# Patient Record
Sex: Male | Born: 1950 | Race: White | Hispanic: No | Marital: Married | Smoking: Never smoker
Health system: Southern US, Community
[De-identification: ages and names within clinical notes are randomized; demographics above are authoritative.]

## PROBLEM LIST (undated history)

## (undated) DIAGNOSIS — I4891 Unspecified atrial fibrillation: Secondary | ICD-10-CM

## (undated) HISTORY — PX: THORACOTOMY: SUR1349

---

## 2019-09-24 ENCOUNTER — Encounter (HOSPITAL_BASED_OUTPATIENT_CLINIC_OR_DEPARTMENT_OTHER): Payer: Self-pay | Admitting: Emergency Medicine

## 2019-09-24 ENCOUNTER — Emergency Department (HOSPITAL_BASED_OUTPATIENT_CLINIC_OR_DEPARTMENT_OTHER): Payer: Medicare Other

## 2019-09-24 ENCOUNTER — Other Ambulatory Visit: Payer: Self-pay

## 2019-09-24 ENCOUNTER — Emergency Department (HOSPITAL_BASED_OUTPATIENT_CLINIC_OR_DEPARTMENT_OTHER)
Admission: EM | Admit: 2019-09-24 | Discharge: 2019-09-24 | Disposition: A | Discharge status: Other Acute Inpatient Hospital | Payer: Medicare Other | Attending: Internal Medicine | Admitting: Internal Medicine

## 2019-09-24 DIAGNOSIS — R1011 Right upper quadrant pain: Secondary | ICD-10-CM

## 2019-09-24 DIAGNOSIS — K56609 Unspecified intestinal obstruction, unspecified as to partial versus complete obstruction: Secondary | ICD-10-CM

## 2019-09-24 DIAGNOSIS — I4891 Unspecified atrial fibrillation: Secondary | ICD-10-CM | POA: Diagnosis present

## 2019-09-24 DIAGNOSIS — Z20822 Contact with and (suspected) exposure to covid-19: Secondary | ICD-10-CM | POA: Insufficient documentation

## 2019-09-24 DIAGNOSIS — R112 Nausea with vomiting, unspecified: Secondary | ICD-10-CM

## 2019-09-24 HISTORY — DX: Unspecified atrial fibrillation: I48.91

## 2019-09-24 LAB — CBC
HCT: 47.6 % (ref 39.0–52.0)
Hemoglobin: 16.4 g/dL (ref 13.0–17.0)
MCH: 32.4 pg (ref 26.0–34.0)
MCHC: 34.5 g/dL (ref 30.0–36.0)
MCV: 94.1 fL (ref 80.0–100.0)
Platelets: 211 10*3/uL (ref 150–400)
RBC: 5.06 MIL/uL (ref 4.22–5.81)
RDW: 13.2 % (ref 11.5–15.5)
WBC: 9.1 10*3/uL (ref 4.0–10.5)
nRBC: 0 % (ref 0.0–0.2)

## 2019-09-24 LAB — URINALYSIS, ROUTINE W REFLEX MICROSCOPIC
Glucose, UA: NEGATIVE mg/dL
Ketones, ur: 15 mg/dL — AB
Leukocytes,Ua: NEGATIVE
Nitrite: NEGATIVE
Protein, ur: NEGATIVE mg/dL
Specific Gravity, Urine: 1.025 (ref 1.005–1.030)
pH: 6 (ref 5.0–8.0)

## 2019-09-24 LAB — COMPREHENSIVE METABOLIC PANEL
ALT: 15 U/L (ref 0–44)
AST: 29 U/L (ref 15–41)
Albumin: 4.2 g/dL (ref 3.5–5.0)
Alkaline Phosphatase: 47 U/L (ref 38–126)
Anion gap: 14 (ref 5–15)
BUN: 27 mg/dL — ABNORMAL HIGH (ref 8–23)
CO2: 25 mmol/L (ref 22–32)
Calcium: 9.9 mg/dL (ref 8.9–10.3)
Chloride: 98 mmol/L (ref 98–111)
Creatinine, Ser: 1.23 mg/dL (ref 0.61–1.24)
GFR calc Af Amer: >60 mL/min (ref >60)
GFR calc non Af Amer: 59 mL/min — ABNORMAL LOW (ref >60)
Glucose, Bld: 133 mg/dL — ABNORMAL HIGH (ref 70–99)
Potassium: 4.2 mmol/L (ref 3.5–5.1)
Sodium: 137 mmol/L (ref 135–145)
Total Bilirubin: 2.2 mg/dL — ABNORMAL HIGH (ref 0.3–1.2)
Total Protein: 7 g/dL (ref 6.5–8.1)

## 2019-09-24 LAB — URINALYSIS, MICROSCOPIC (REFLEX)

## 2019-09-24 LAB — TROPONIN I (HIGH SENSITIVITY)
Troponin I (High Sensitivity): 8 ng/L (ref <18)
Troponin I (High Sensitivity): 9 ng/L (ref <18)

## 2019-09-24 LAB — OCCULT BLOOD X 1 CARD TO LAB, STOOL: Fecal Occult Bld: NEGATIVE

## 2019-09-24 LAB — SARS CORONAVIRUS 2 BY RT PCR (HOSPITAL ORDER, PERFORMED IN ~~LOC~~ HOSPITAL LAB): SARS Coronavirus 2: NEGATIVE

## 2019-09-24 LAB — LIPASE, BLOOD: Lipase: 25 U/L (ref 11–51)

## 2019-09-24 LAB — PROTIME-INR
INR: 1.1 (ref 0.8–1.2)
Prothrombin Time: 13.9 seconds (ref 11.4–15.2)

## 2019-09-24 MED ORDER — METOPROLOL TARTRATE 5 MG/5ML IV SOLN
5.0000 mg | Freq: Once | INTRAVENOUS | Status: AC
  Administered 2019-09-24: 5 mg via INTRAVENOUS
  Filled 2019-09-24: qty 5

## 2019-09-24 MED ORDER — SODIUM CHLORIDE 0.9 % IV BOLUS
500.0000 mL | Freq: Once | INTRAVENOUS | Status: AC
  Administered 2019-09-24: 500 mL via INTRAVENOUS

## 2019-09-24 MED ORDER — IOHEXOL 300 MG/ML  SOLN
100.0000 mL | Freq: Once | INTRAMUSCULAR | Status: AC | PRN
  Administered 2019-09-24: 100 mL via INTRAVENOUS

## 2019-09-24 MED ORDER — APIXABAN 2.5 MG PO TABS: 5.0000 mg | ORAL_TABLET | Freq: Two times a day (BID) | ORAL | Status: DC

## 2019-09-24 MED ORDER — SODIUM CHLORIDE 0.9 % IV BOLUS (SEPSIS)
500.0000 mL | Freq: Once | INTRAVENOUS | Status: AC
  Administered 2019-09-24: 500 mL via INTRAVENOUS

## 2019-09-24 MED ORDER — SODIUM CHLORIDE 0.9 % IV SOLN
1000.0000 mL | INTRAVENOUS | Status: DC
  Administered 2019-09-24: 1000 mL via INTRAVENOUS

## 2019-09-24 MED ORDER — FLECAINIDE ACETATE 50 MG PO TABS
50.0000 mg | ORAL_TABLET | Freq: Once | ORAL | Status: AC
  Administered 2019-09-24: 50 mg via ORAL
  Filled 2019-09-24: qty 1

## 2019-09-24 MED ORDER — HEPARIN (PORCINE) 25000 UT/250ML-% IV SOLN
1250.0000 [IU]/h | INTRAVENOUS | Status: DC
  Administered 2019-09-24: 1250 [IU]/h via INTRAVENOUS
  Filled 2019-09-24: qty 250

## 2019-09-24 NOTE — ED Provider Notes (Signed)
Blood pressure (!) 124/91, pulse (!) 128, temperature 99.4 F (37.4 C), temperature source Oral, resp. rate (!) 24, height 5\' 9"  (1.753 m), weight 83.9 kg, SpO2 96 %.  Assuming care from Dr. .  In short, Alvin Rodriguez is a 69 y.o. male with a chief complaint of Abdominal Pain .  Refer to the original H&P for additional details.  04:45 PM  Patient's A. fib with RVR better rate controlled with as needed Lopressor and flecainide.  At the time of shift change the patient expressed her preference to actually be admitted to [facility].  They contacted their cardiology team and a bed will be made for the patient.  I discussed the case with Dr. [facility] including the relatively late finding of small bowel obstruction on CT scan.  Plan to place NG tube here.  Dr. Lucianne Muss will accept the patient to their medical service and they will consult general surgery once the patient arrives.   Discussed patient's case with Hospitalist Dr. Lucianne Muss to request admission. Patient and family (if present) updated with plan. Care transferred to Hospitalist service.  I reviewed all nursing notes, vitals, pertinent old records, EKGs, labs, imaging (as available).     Lucianne Muss, MD 09/24/19 [phone]

## 2019-09-24 NOTE — ED Notes (Signed)
Shirt, shoes and pants in pt belonging bag and given to transport team members

## 2019-09-24 NOTE — ED Notes (Signed)
[facility] - Dr Sampson Goon, MD

## 2019-09-24 NOTE — ED Notes (Signed)
[facility] Transport Team at bedside 

## 2019-09-24 NOTE — ED Triage Notes (Signed)
Mid to upper abd pain with vomiting since yesterday.

## 2019-09-24 NOTE — ED Notes (Signed)
ED Provider at bedside. To discuss findings of CT scan, plan of care and also on status of tx to [facility]

## 2019-09-24 NOTE — ED Notes (Signed)
Moved to exam room ED4, cont on cardiac monitoring, Afib with RVR at times. Pt remains NPO until further notice

## 2019-09-24 NOTE — ED Notes (Signed)
PCXR done

## 2019-09-24 NOTE — ED Notes (Signed)
No further vomiting noted since arrival to ED

## 2019-09-24 NOTE — ED Notes (Signed)
VS assessed prior to IV meds

## 2019-09-24 NOTE — ED Notes (Signed)
ED Provider at bedside. Informed that Metoprolol 25mg  PO, Flecanide 50mg  PO and Eliquis 5mg  PO was last take yesterday MA

## 2019-09-24 NOTE — ED Notes (Signed)
 NS bolus initiated per verbal orders of EDP, to follow with 142ml/hr

## 2019-09-24 NOTE — ED Provider Notes (Signed)
[facility] EMERGENCY DEPARTMENT Provider Note   CSN: [id number] Arrival date & time: 09/24/19  0957     History Chief Complaint  Patient presents with   Abdominal Pain    Alvin Rodriguez is a 69 y.o. male.  HPI Patient reports he awakened yesterday morning and by midmorning felt some upper abdominal discomfort.  He did not think it was too serious.  Several hours later however pain got much worse and he vomited a large amount.  He reports is a vomitus was dark in appearance.  He continued to have persistent pain which was fairly aching in pressure-like in the mid and upper abdomen.  He went on to have dry heaves through the afternoon.  He was unable to tolerate his medications yesterday.  Later in the night he had another episode of large amount of emesis with dark appearance.  This morning, severity and quality of abdominal pain have diminished and he now rates his abdominal pain is just a "discomfort".  Patient reports he cannot typically tell if he is in atrial fibrillation.  He does not perceive his heart to be racing or missing beats.  Due to vomiting episodes and abdominal pain, he has missed his doses of metoprolol and flecainide.  Also he has missed Eliquis.  He denies he feels significantly short of breath.  He has never experienced similar abdominal pain problems.  No history of abdominal surgeries.    Past Medical History:  Diagnosis Date   Atrial fibrillation [facility])     Patient Active Problem List   Diagnosis Date Noted   Atrial fibrillation with RVR (HCC) 09/24/2019    Past Surgical History:  Procedure Laterality Date   THORACOTOMY         No family history on file.  Social History   Tobacco Use   Smoking status: Never Smoker   Smokeless tobacco: Never Used  Substance Use Topics   Alcohol use: Yes    Comment: daily   Drug use: Never    Home Medications Prior to Admission medications   Medication Sig Start Date End Date Taking? Authorizing  Provider  apixaban (ELIQUIS) 5 MG TABS tablet Take 5 mg by mouth 2 (two) times daily.   Yes [provider]  flecainide (TAMBOCOR) 50 MG tablet Take 50 mg by mouth 2 (two) times daily.   Yes [provider]  metoprolol tartrate (LOPRESSOR) 25 MG tablet Take 25 mg by mouth 2 (two) times daily.   Yes [provider]  promethazine (PHENERGAN) 25 MG tablet Take 25 mg by mouth every 6 (six) hours as needed for nausea or vomiting.   Yes [provider]  sucralfate (CARAFATE) 1 g tablet Take 1 g by mouth 4 (four) times daily -  with meals and at bedtime.   Yes [provider]    Allergies    Patient has no known allergies.  Review of Systems   Review of Systems 10 systems reviewed and negative except as per HPI Physical Exam Updated Vital Signs BP (!) 124/91 (BP Location: Right Arm)    Pulse (!) 128 Comment: Afib   Temp 99.4 F (37.4 C) (Oral)    Resp (!) 24    Ht 5\' 9"  (1.753 m)    Wt 83.9 kg    SpO2 96%    BMI 27.32 kg/m   Physical Exam Constitutional:      Comments: Alert and nontoxic.  Mental status clear.  Well-nourished well-developed.  No respiratory distress at rest.  HENT:     Mouth/Throat:     Mouth: Mucous membranes are moist.     Pharynx: Oropharynx is clear.  Eyes:     Extraocular Movements: Extraocular movements intact.     Conjunctiva/sclera: Conjunctivae normal.  Cardiovascular:     Comments: Tachycardia, irregularly irregular. Pulmonary:     Effort: Pulmonary effort is normal.     Breath sounds: Normal breath sounds.  Abdominal:     Comments: Abdomen is soft without guarding.  Very mild epigastric discomfort to palpation.  No guarding in right upper quadrant or right lower quadrant.  Genitourinary:    Comments: Prostate mildly enlarged.  Brown stool in the vault.  No melena no visible blood. Musculoskeletal:        General: No swelling or tenderness. Normal range of motion.     Right lower leg: No edema.     Left lower  leg: No edema.  Skin:    General: Skin is warm and dry.  Neurological:     General: No focal deficit present.     Mental Status: He is oriented to person, place, and time.     Coordination: Coordination normal.  Psychiatric:        Mood and Affect: Mood normal.     ED Results / Procedures / Treatments   Labs (all labs ordered are listed, but only abnormal results are displayed) Labs Reviewed  COMPREHENSIVE METABOLIC PANEL - Abnormal; Notable for the following components:      Result Value   Glucose, Bld 133 (*)    BUN 27 (*)    Total Bilirubin 2.2 (*)    GFR calc non Af Amer 59 (*)    All other components within normal limits  URINALYSIS, ROUTINE W REFLEX MICROSCOPIC - Abnormal; Notable for the following components:   Hgb urine dipstick SMALL (*)    Bilirubin Urine SMALL (*)    Ketones, ur 15 (*)    All other components within normal limits  URINALYSIS, MICROSCOPIC (REFLEX) - Abnormal; Notable for the following components:   Bacteria, UA MANY (*)    All other components within normal limits  SARS CORONAVIRUS 2 BY RT PCR (HOSPITAL ORDER, PERFORMED IN [facility])  LIPASE, BLOOD  CBC  PROTIME-INR  OCCULT BLOOD X 1 CARD TO LAB, STOOL  TROPONIN I (HIGH SENSITIVITY)  TROPONIN I (HIGH SENSITIVITY)    EKG EKG Interpretation  Date/Time:  Saturday September 24 2019 11:17:57 EDT Ventricular Rate:  162 PR Interval:    QRS Duration: 84 QT Interval:  298 QTC Calculation: 489 R Axis:   16 Text Interpretation: ST & T wave abnormality, consider lateral ischemia Abnormal ECG Confirmed by Arby Barrette [phone]) on 09/24/2019 2:14:04 PM   Radiology CT Abdomen Pelvis W Contrast  Result Date: 09/24/2019 CLINICAL DATA:  Nausea and vomiting with abdominal pain. EXAM: CT ABDOMEN AND PELVIS WITH CONTRAST TECHNIQUE: Multidetector CT imaging of the abdomen and pelvis was performed using the standard protocol following bolus administration of intravenous contrast. CONTRAST:   OMNIPAQUE IOHEXOL 300 MG/ML  SOLN COMPARISON:  None. FINDINGS: Lower chest: A chronic deformity of the posterior lateral left ribcage is seen. Mild atelectasis is noted within the right lung base. Hepatobiliary: No focal liver abnormality is seen. No gallstones, gallbladder wall thickening, or biliary dilatation. Pancreas: Unremarkable. No pancreatic ductal dilatation or surrounding inflammatory changes. Spleen: Normal in size without focal abnormality. Adrenals/Urinary Tract: Adrenal glands are unremarkable. Kidneys are normal, without renal calculi, focal lesion, or hydronephrosis. The  urinary bladder is limited in evaluation secondary to the presence of overlying streak artifact. Stomach/Bowel: There is a small hiatal hernia. Appendix appears normal. Several dilated small bowel loops are seen within the lower abdomen and pelvis a transition zone is seen within the anterior aspect of the mid to lower right abdomen (axial CT images 48 through 58, CT series number 2). Noninflamed diverticula are seen throughout the large bowel. Vascular/Lymphatic: There is mild calcification of the abdominal aorta and bilateral common iliac arteries, without evidence of aneurysmal dilatation. No enlarged abdominal or pelvic lymph nodes. Reproductive: The prostate gland appears to be normal but is limited in evaluation secondary to overlying streak artifact. Other: A 7.9 cm x 1.5 cm fat containing ventral hernia is seen along the midline of the mid to lower abdomen. No abdominopelvic ascites. Musculoskeletal: A total left hip replacement is seen with associated streak artifact and subsequently limited evaluation of the adjacent osseous and soft tissue structures. There is approximately 2 mm anterolisthesis of the L4 vertebral body on L5 with moderate to marked severity multilevel degenerative changes noted throughout the lumbar spine. IMPRESSION: 1. Small-bowel obstruction with a transition zone seen within the anterior aspect  of the mid to lower right abdomen. 2. Colonic diverticulosis. 3. Small hiatal hernia. 4. 7.9 cm x 1.5 cm fat containing ventral hernia. 5. Total left hip replacement. 6. 2 mm anterolisthesis of the L4 vertebral body on L5 with moderate to marked severity multilevel degenerative changes throughout the lumbar spine. 7. Aortic atherosclerosis. Aortic Atherosclerosis (ICD10-I70.0). Electronically Signed   By: Aram Candelahaddeus  Houston M.D.   On: 09/24/2019 15:12   DG Chest Port 1 View  Result Date: 09/24/2019 CLINICAL DATA:  69 year old male with a history of upper abdominal pain EXAM: PORTABLE CHEST 1 VIEW COMPARISON:  08/16/2019 FINDINGS: Cardiomediastinal silhouette unchanged in size and contour. Low lung volumes. No pneumothorax. No pleural effusion. No confluent airspace disease. Coarsened interstitial markings persist. IMPRESSION: Low lung volumes and chronic lung changes without evidence of acute cardiopulmonary disease. Electronically Signed   By: Gilmer MorJaime  Wagner D.O.   On: 09/24/2019 12:11   US Abdomen Limited RUQ  Result Date: 09/24/2019 CLINICAL DATA:  Epigastric pain and vomiting EXAM: ULTRASOUND ABDOMEN LIMITED RIGHT UPPER QUADRANT COMPARISON:  None. FINDINGS: Gallbladder: No gallstones or wall thickening visualized. No sonographic Murphy sign noted by sonographer. Common bile duct: Diameter: 2 mm, normal Liver: Evaluation is limited secondary to body habitus. No focal lesion identified. Within normal limits in parenchymal echogenicity. Portal vein is patent on color Doppler imaging with normal direction of blood flow towards the liver. Other: None. IMPRESSION: No sonographic etiology for acute abdominal pain identified. Electronically Signed   By: Meda KlinefelterStephanie  Peacock MD   On: 09/24/2019 12:22    Procedures Procedures (including critical care time) CRITICAL CARE Performed by: Arby BarretteMarcy Elston Aldape   Total critical care time: 45  minutes  Critical care time was exclusive of separately billable procedures  and treating other patients.  Critical care was necessary to treat or prevent imminent or life-threatening deterioration.  Critical care was time spent personally by me on the following activities: development of treatment plan with patient and/or surrogate as well as nursing, discussions with consultants, evaluation of patient's response to treatment, examination of patient, obtaining history from patient or surrogate, ordering and performing treatments and interventions, ordering and review of laboratory studies, ordering and review of radiographic studies, pulse oximetry and re-evaluation of patient's condition. Medications Ordered in ED Medications  sodium chloride 0.9 % bolus 500  mL (0 mLs Intravenous Stopped 09/24/19 1319)    Followed by  0.9 %  sodium chloride infusion (1,000 mLs Intravenous New Bag/Given 09/24/19 1534)  metoprolol tartrate (LOPRESSOR) injection 5 mg (5 mg Intravenous Given 09/24/19 1151)  sodium chloride 0.9 % bolus 500 mL (0 mLs Intravenous Stopped 09/24/19 1319)  metoprolol tartrate (LOPRESSOR) injection 5 mg (5 mg Intravenous Given 09/24/19 1249)  flecainide (TAMBOCOR) tablet 50 mg (50 mg Oral Given 09/24/19 1316)  metoprolol tartrate (LOPRESSOR) injection 5 mg (5 mg Intravenous Given 09/24/19 1503)  iohexol (OMNIPAQUE) 300 MG/ML solution 100 mL (100 mLs Intravenous Contrast Given 09/24/19 1439)    ED Course  I have reviewed the triage vital signs and the nursing notes.  Pertinent labs & imaging results that were available during my care of the patient were reviewed by me and considered in my medical decision making (see chart for details).  Clinical Course as of Sep 23 1613  Sat Sep 24, 2019  1423 Consult: Reviewed with Dr. Dairl Ponder for admission   [MP]    Clinical Course User Index [MP] Arby Barrette, MD   MDM Rules/Calculators/A&P                         Patient presents as outlined above.  He does have history of atrial fibrillation.  Patient was unable to take  his Eliquis and his metoprolol and flecainide due to symptoms.  He does present with atrial fibrillation rapid ventricular response.  Rehydration with normal saline started as well as rate control with metoprolol and patient's baseline flecainide dose.  CT scan does show small bowel obstruction.  Patient has not had any vomiting since being in the emergency department.  He also has not been having any ongoing abdominal pain.  At this time, due to bowel obstruction will hold Eliquis and need to review starting heparin with admitting team and surgery.  She remained stable with clear mental status and no respiratory distress.  We will continue hydration and n.p.o. status.  Dr. Jacqulyn Bath will discuss with admitting team. Final Clinical Impression(s) / ED Diagnoses Final diagnoses:  RUQ pain  Atrial fibrillation with rapid ventricular response (HCC)  Non-intractable vomiting with nausea, unspecified vomiting type  SBO (small bowel obstruction) (HCC)    Rx / DC Orders ED Discharge Orders    None       Arby Barrette, MD 09/24/19 1615

## 2019-09-24 NOTE — Progress Notes (Signed)
ANTICOAGULATION CONSULT NOTE - Initial Consult  Pharmacy Consult for heparin  Indication: atrial fibrillation  No Known Allergies  Patient Measurements: Height: 5\' 9"  (175.3 cm) Weight: 83.9 kg (185 lb) IBW/kg (Calculated) : 70.7 Heparin Dosing Weight: 83.9 kg   Vital Signs: Temp: 99.4 F (37.4 C) (09/18 1007) Temp Source: Oral (09/18 1007) BP: 124/91 (09/18 1529) Pulse Rate: 128 (09/18 1529)  Labs: Recent Labs    09/24/19 1009 09/24/19 1320  HGB 16.4  --   HCT 47.6  --   PLT 211  --   LABPROT 13.9  --   INR 1.1  --   CREATININE 1.23  --   TROPONINIHS 8 9    Estimated Creatinine Clearance: 55.9 mL/min (by C-G formula based on SCr of 1.23 mg/dL).   Medical History: Past Medical History:  Diagnosis Date  . Atrial fibrillation (HCC)     Medications:  (Not in a hospital admission)   Assessment: 69 YOM who presents with N/V. Of note, patient is on apixaban at home for h/o Afib but was unable to take his meds today in the setting of N/V. Pharmacy consulted to start IV heparin  H/H and Plt wnl. SCr 1.23.   Goal of Therapy:  Heparin level 0.3-0.7 units/ml Monitor platelets by anticoagulation protocol: Yes   Plan:  -IV heparin at 1250 units/hr. No since he is on apixaban at home.  -F/u 8 hr aPTT/HL -Monitor daily HL, CBC and s/s of bleeding   65, PharmD., BCPS, BCCCP Clinical Pharmacist Clinical phone for 09/24/19 until 10pm: [phone] If after 10pm, please refer to [facility] for unit-specific pharmacist

## 2021-03-27 IMAGING — CT CT ABD-PELV W/ CM
2 of 5 series · 15 of 46 positions shown, 17 images · IV contrast (Omnipaque)
Comparison: None.

CLINICAL DATA: Nausea and vomiting with abdominal pain.

EXAM:
CT ABDOMEN AND PELVIS WITH CONTRAST
TECHNIQUE: Multidetector CT imaging of the abdomen and pelvis was performed
using the standard protocol following bolus administration of
intravenous contrast.
CONTRAST:  100mL OMNIPAQUE IOHEXOL 300 MG/ML  SOLN

[Series 2: axial st · axial · 0.96mm/px · z∈[-578,-48]mm · 12 of 118 slices shown, 14 images]
[im 6/118  soft-tissue]
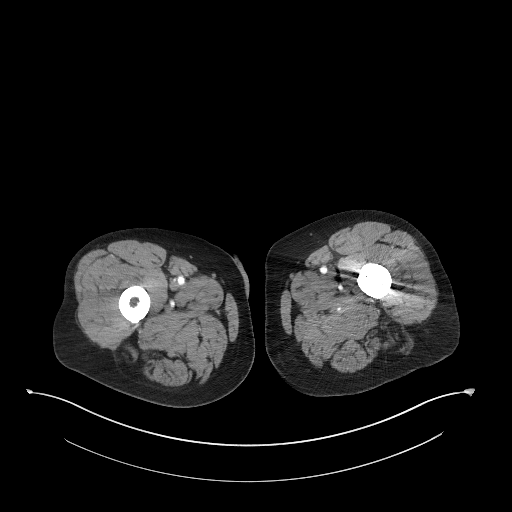
[im 6/118  bone]
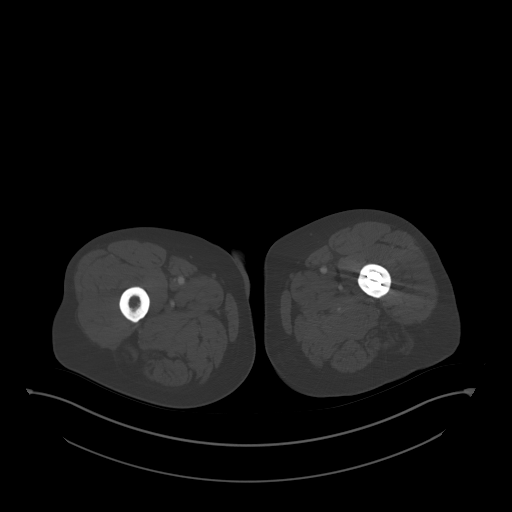
[im 17/118  soft-tissue]
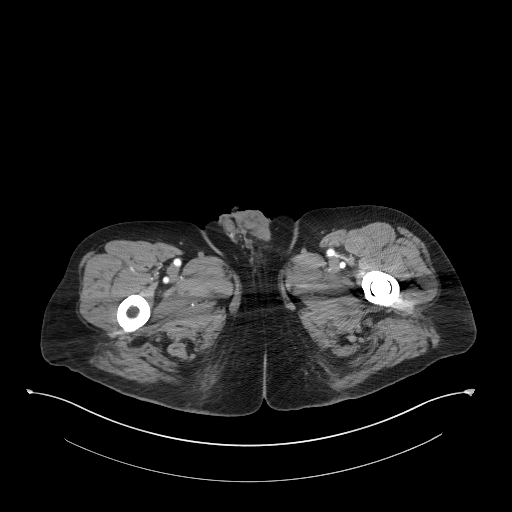
[im 28/118  soft-tissue]
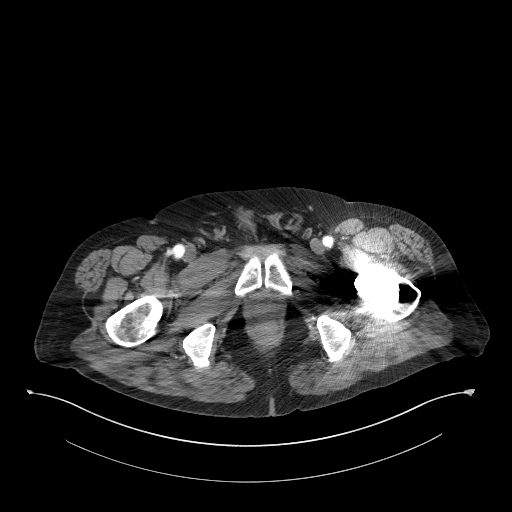
[im 34/118  soft-tissue]
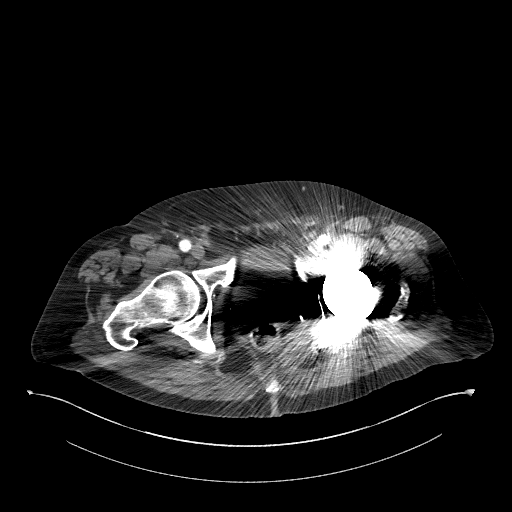
[im 45/118  soft-tissue]
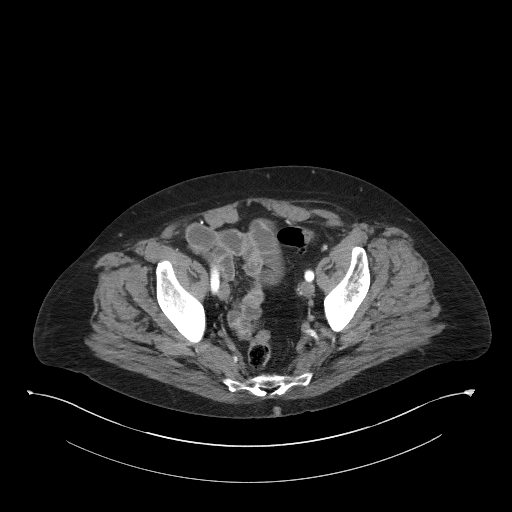
[im 56/118  soft-tissue]
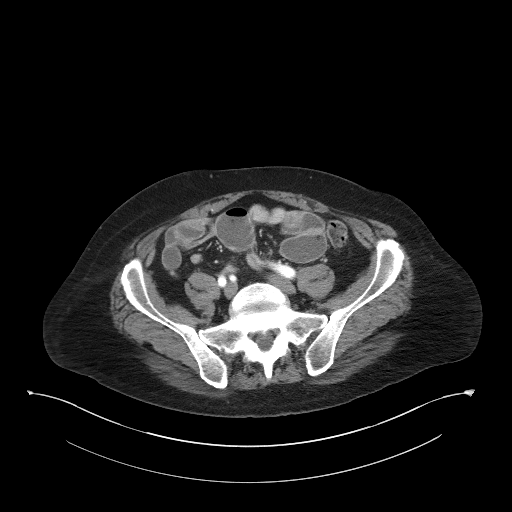
[im 62/118  soft-tissue]
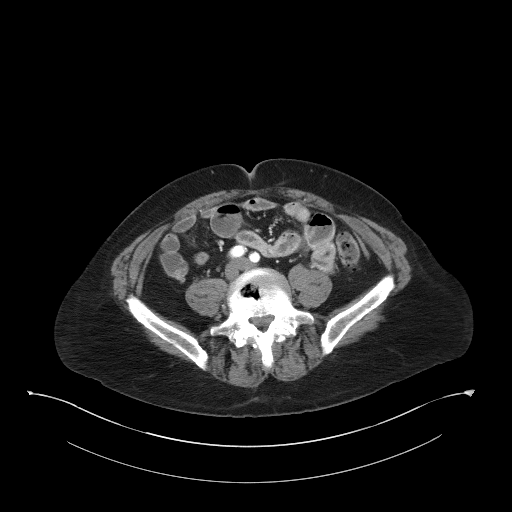
[im 73/118  soft-tissue]
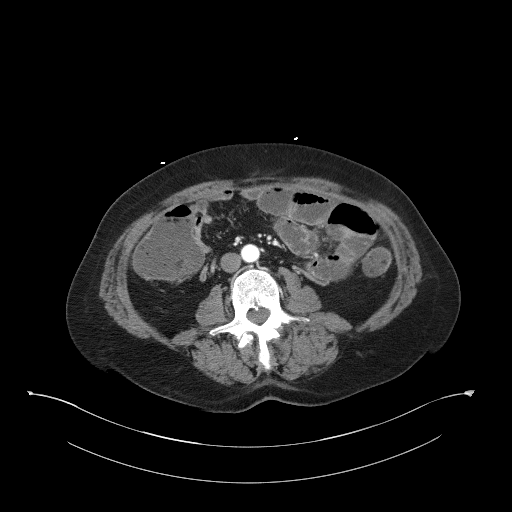
[im 84/118  soft-tissue]
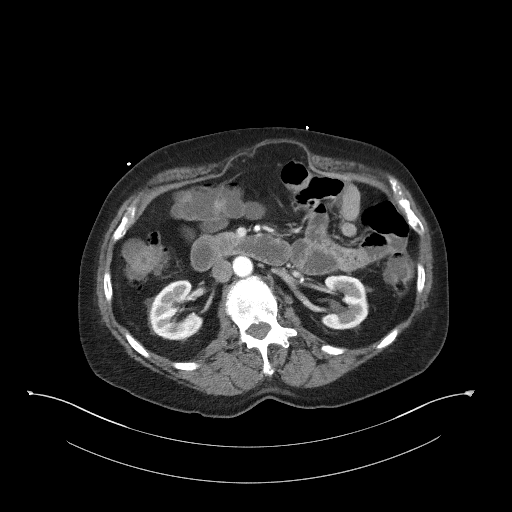
[im 84/118  bone]
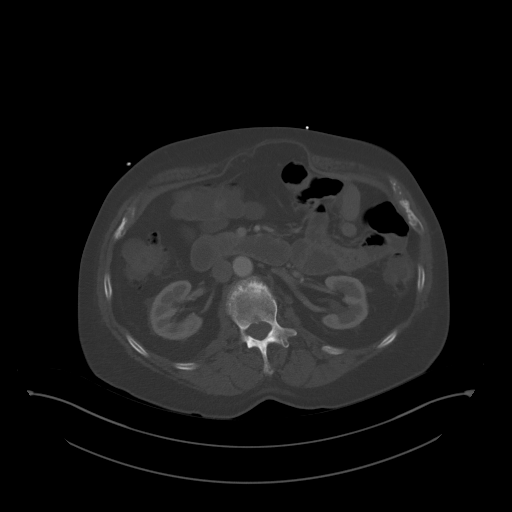
[im 90/118  soft-tissue]
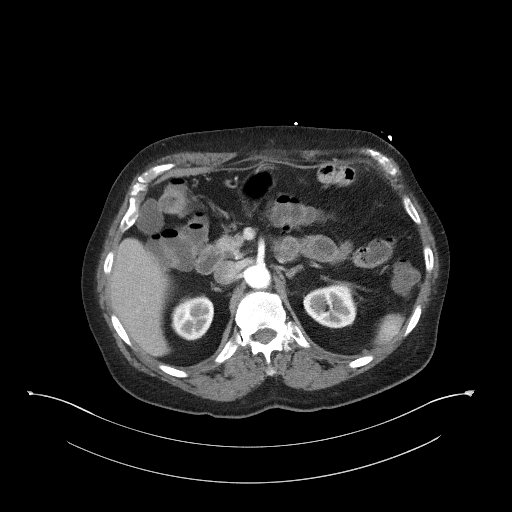
[im 101/118  soft-tissue]
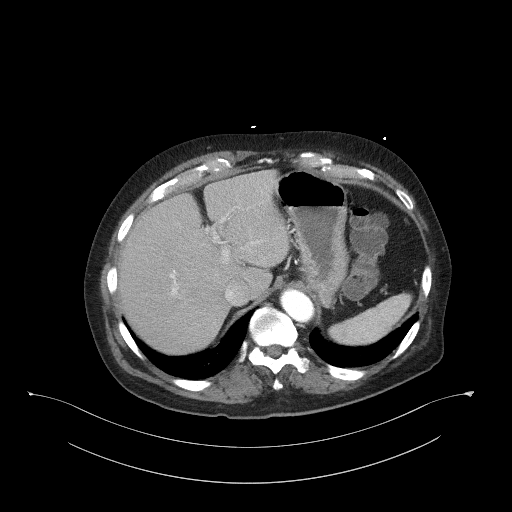
[im 112/118  soft-tissue]
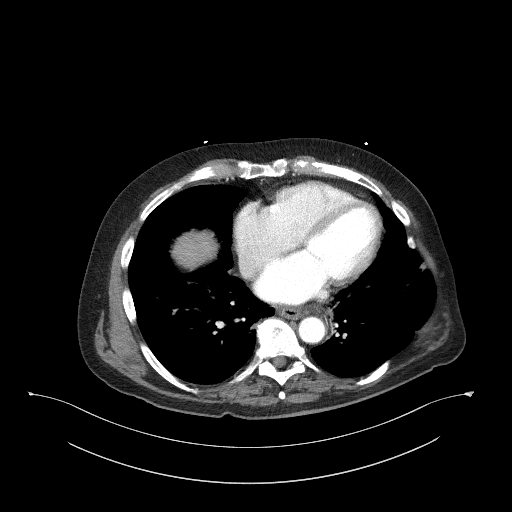

[Series 5: coronal st · coronal · 0.87mm/px · 3 of 95 slices shown]
[im 32/95  soft-tissue]
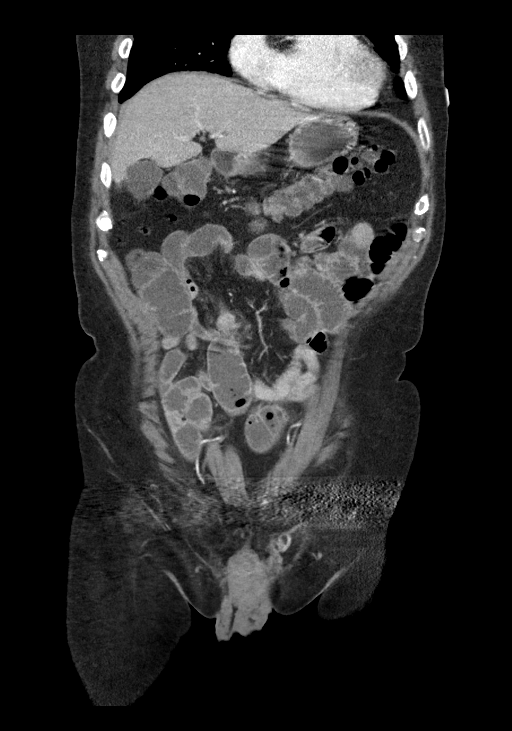
[im 42/95  soft-tissue]
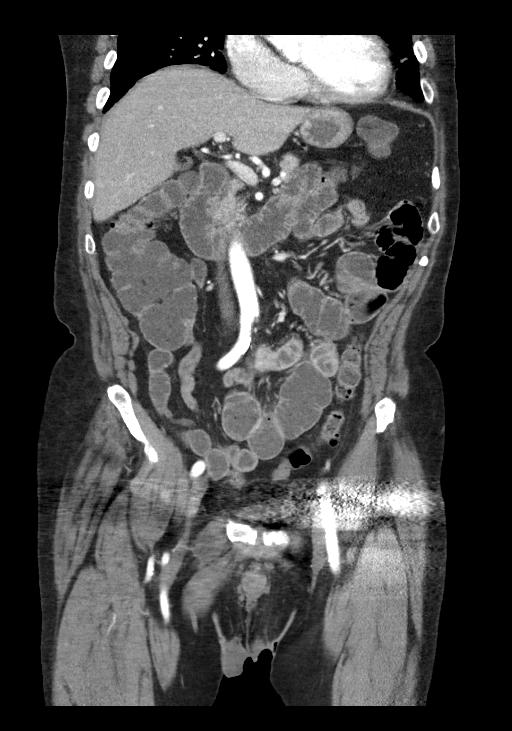
[im 53/95  soft-tissue]
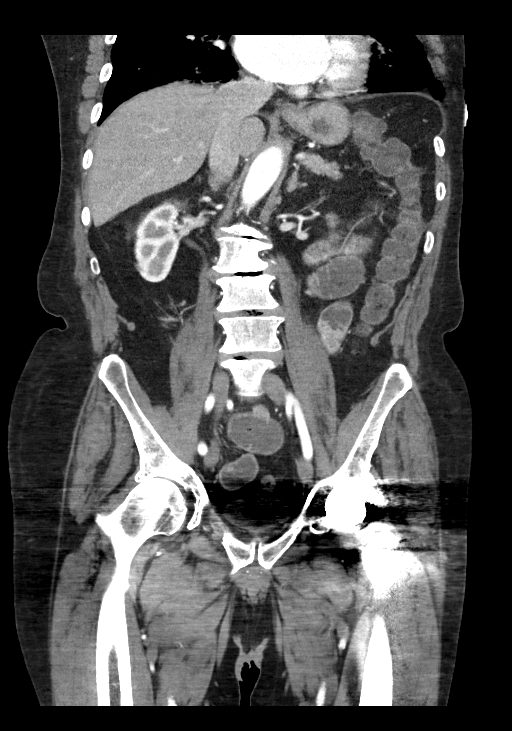

[15 of 46 positions shown; findings below may reference images not displayed]

FINDINGS: Lower chest: A chronic deformity of the posterior lateral left
ribcage is seen.

Mild atelectasis is noted within the right lung base.

Hepatobiliary: No focal liver abnormality is seen. No gallstones,
gallbladder wall thickening, or biliary dilatation.

Pancreas: Unremarkable. No pancreatic ductal dilatation or
surrounding inflammatory changes.

Spleen: Normal in size without focal abnormality.

Adrenals/Urinary Tract: Adrenal glands are unremarkable. Kidneys are
normal, without renal calculi, focal lesion, or hydronephrosis. The
urinary bladder is limited in evaluation secondary to the presence
of overlying streak artifact.

Stomach/Bowel: There is a small hiatal hernia. Appendix appears
normal. Several dilated small bowel loops are seen within the lower
abdomen and pelvis a transition zone is seen within the anterior
aspect of the mid to lower right abdomen (axial CT images 48 through
58, CT series number 2). Noninflamed diverticula are seen throughout
the large bowel.

Vascular/Lymphatic: There is mild calcification of the abdominal
aorta and bilateral common iliac arteries, without evidence of
aneurysmal dilatation. No enlarged abdominal or pelvic lymph nodes.

Reproductive: The prostate gland appears to be normal but is limited
in evaluation secondary to overlying streak artifact.

Other: A 7.9 cm x 1.5 cm fat containing ventral hernia is seen along
the midline of the mid to lower abdomen. No abdominopelvic ascites.

Musculoskeletal: A total left hip replacement is seen with
associated streak artifact and subsequently limited evaluation of
the adjacent osseous and soft tissue structures.

There is approximately 2 mm anterolisthesis of the L4 vertebral body
on L5 with moderate to marked severity multilevel degenerative
changes noted throughout the lumbar spine.
IMPRESSION: 1. Small-bowel obstruction with a transition zone seen within the
anterior aspect of the mid to lower right abdomen.
2. Colonic diverticulosis.
3. Small hiatal hernia.
4. 7.9 cm x 1.5 cm fat containing ventral hernia.
5. Total left hip replacement.
6. 2 mm anterolisthesis of the L4 vertebral body on L5 with moderate
to marked severity multilevel degenerative changes throughout the
lumbar spine.
7. Aortic atherosclerosis.

Aortic Atherosclerosis ([id number]).

## 2021-03-27 IMAGING — US US ABDOMEN LIMITED
1 series · 14 of 25 positions shown · non-contrast
Comparison: None.

CLINICAL DATA: Epigastric pain and vomiting

EXAM:
ULTRASOUND ABDOMEN LIMITED RIGHT UPPER QUADRANT

[Series 1: us abdomen limited · 14 of 33 slices shown]
[im 1/33]
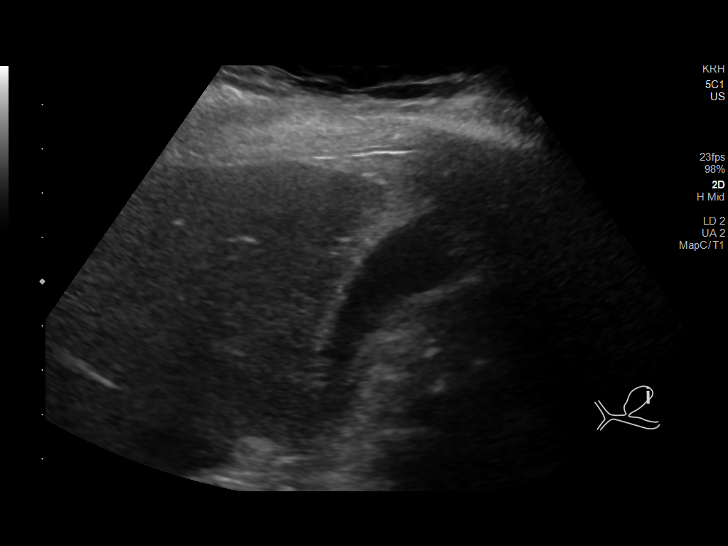
[im 3/33]
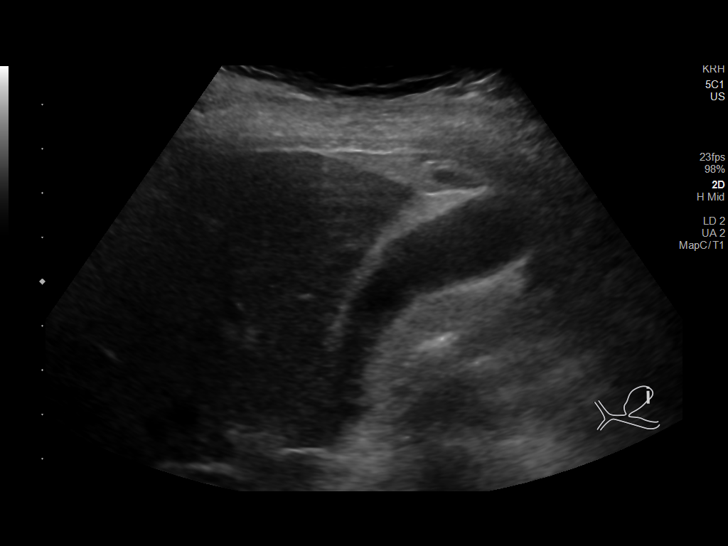
[im 6/33]
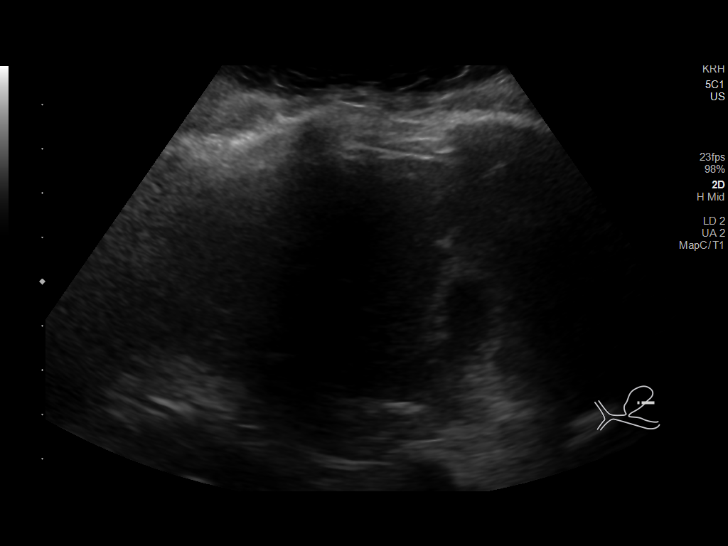
[im 9/33]
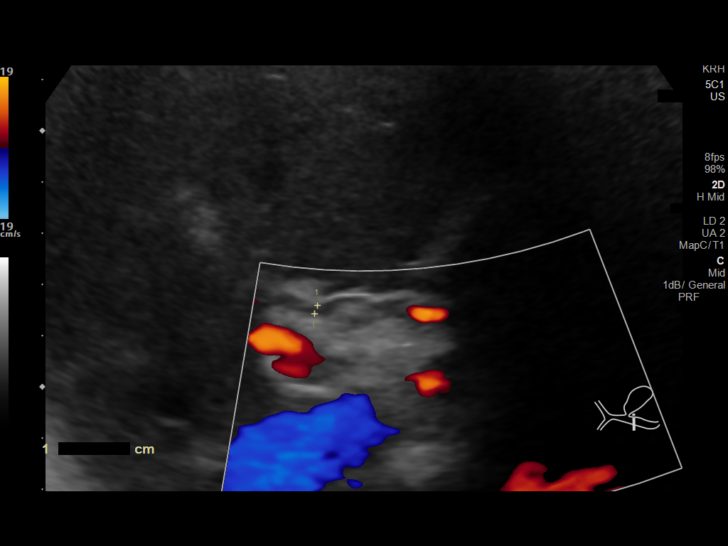
[im 11/33]
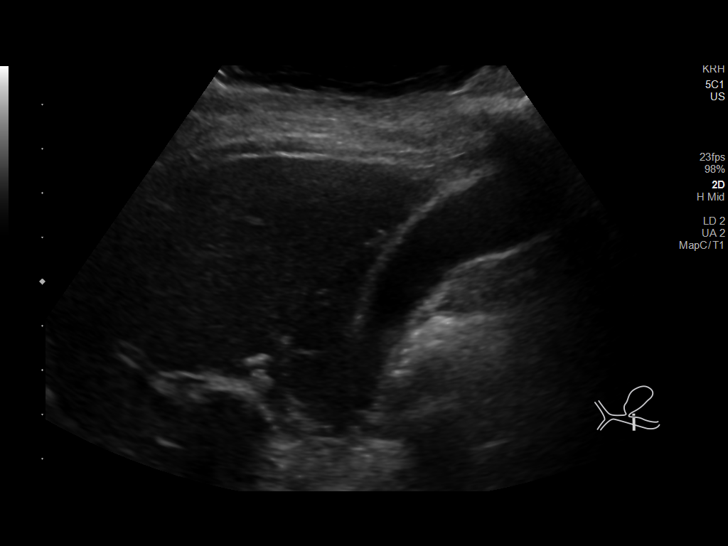
[im 13/33]
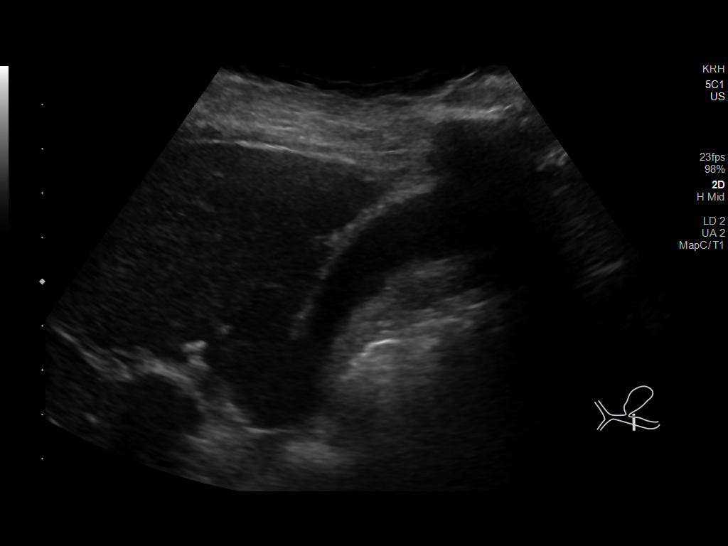
[im 15/33]
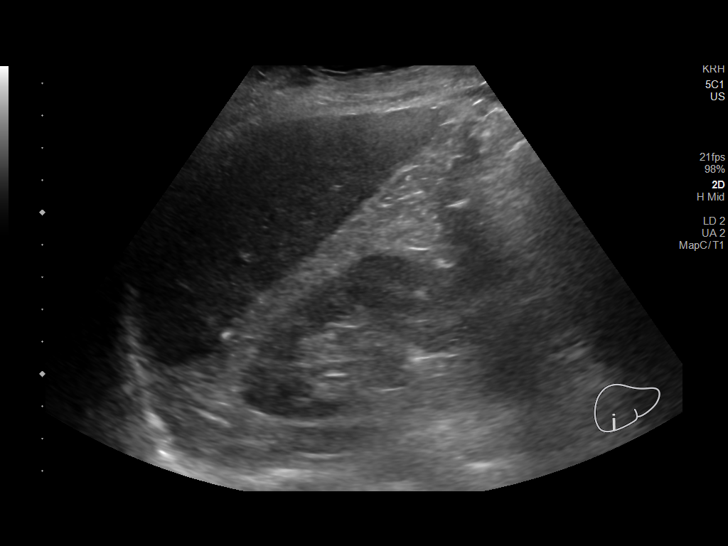
[im 18/33]
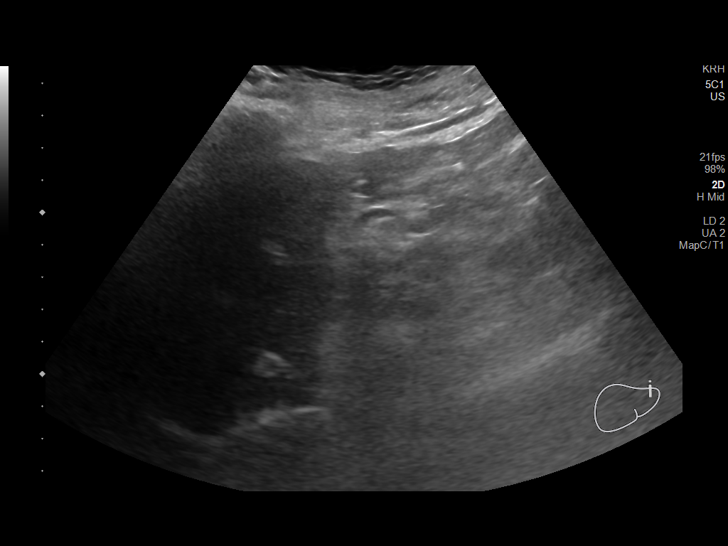
[im 21/33]
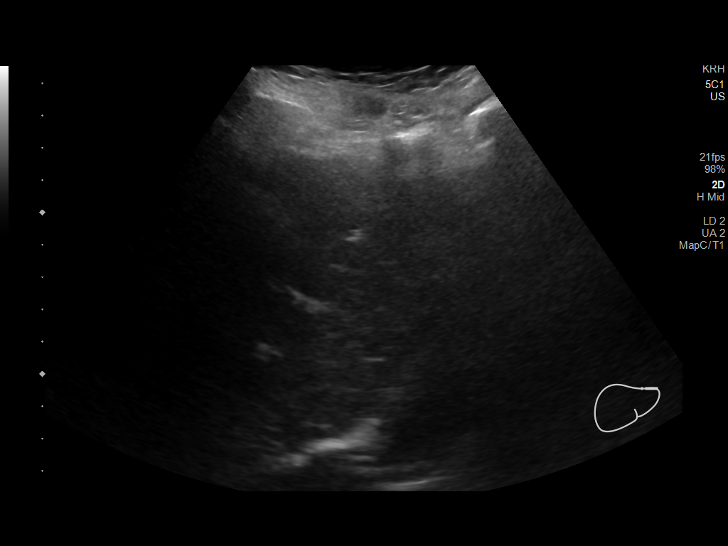
[im 22/33]
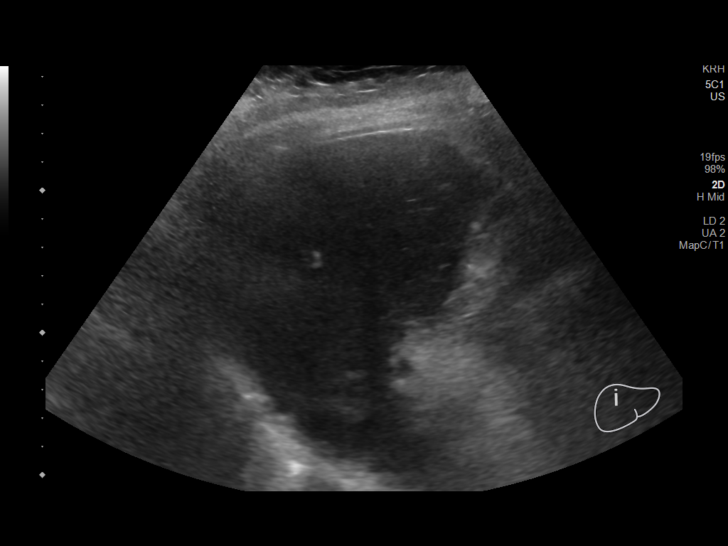
[im 25/33]
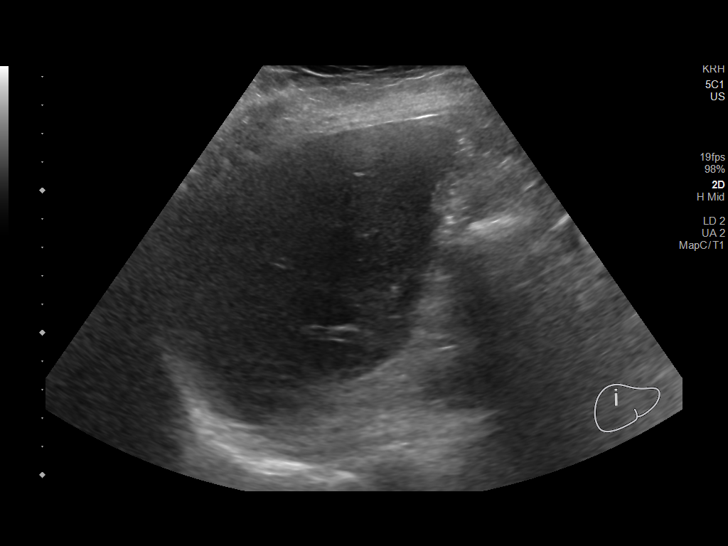
[im 27/33]
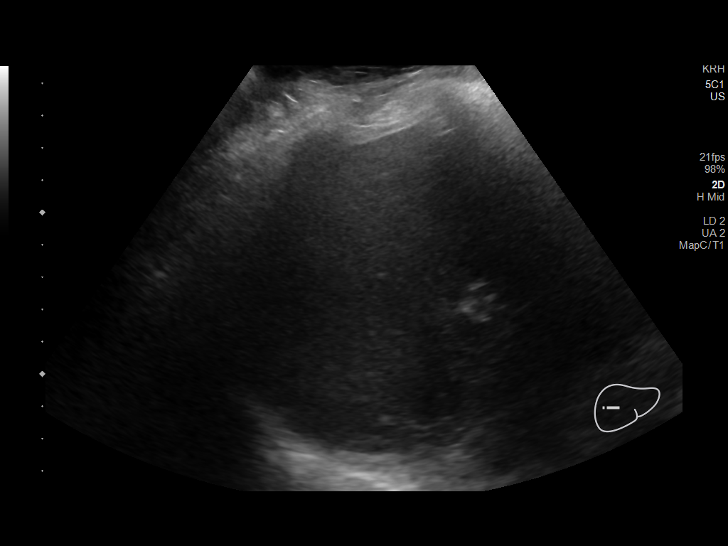
[im 30/33]
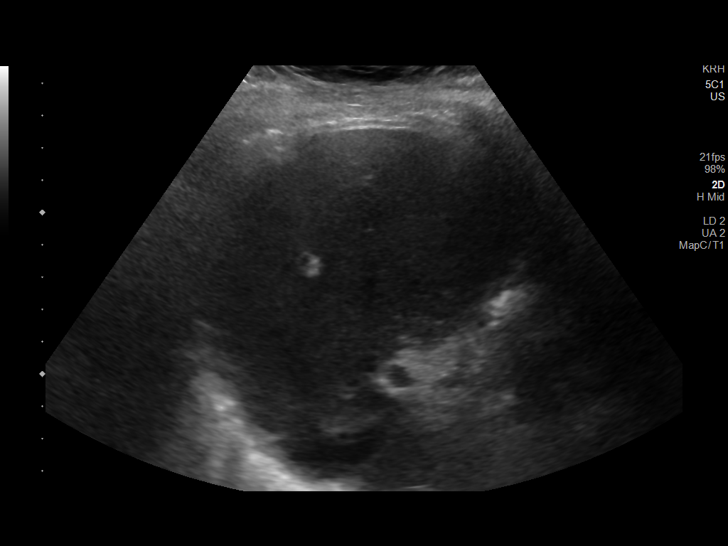
[im 33/33]
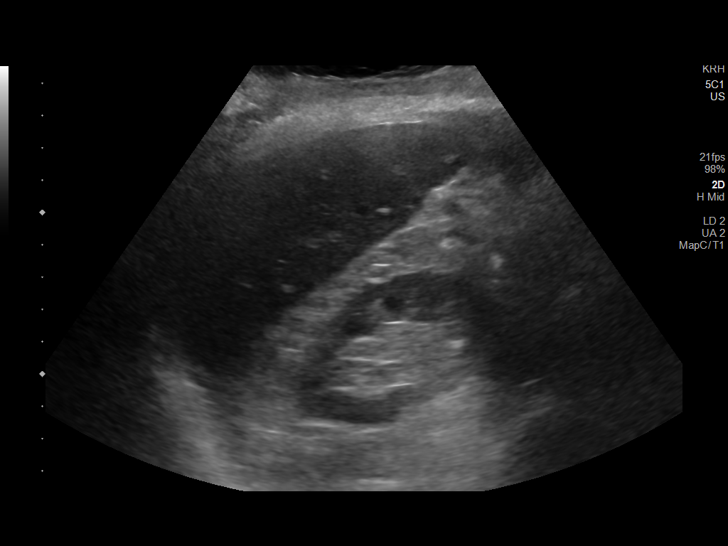

[14 of 25 positions shown; findings below may reference images not displayed]

FINDINGS: Gallbladder:

No gallstones or wall thickening visualized. No sonographic Murphy
sign noted by sonographer.

Common bile duct:

Diameter: 2 mm, normal

Liver:

Evaluation is limited secondary to body habitus. No focal lesion
identified. Within normal limits in parenchymal echogenicity. Portal
vein is patent on color Doppler imaging with normal direction of
blood flow towards the liver.

Other: None.
IMPRESSION: No sonographic etiology for acute abdominal pain identified.
